# Patient Record
Sex: Male | Born: 1985 | Race: White | Hispanic: No | Marital: Single | Smoking: Current every day smoker
Health system: Southern US, Community
[De-identification: ages and names within clinical notes are randomized; demographics above are authoritative.]

---

## 2016-03-07 ENCOUNTER — Emergency Department (HOSPITAL_COMMUNITY): Payer: BLUE CROSS/BLUE SHIELD

## 2016-03-07 ENCOUNTER — Encounter (HOSPITAL_COMMUNITY): Payer: Self-pay | Admitting: Emergency Medicine

## 2016-03-07 ENCOUNTER — Emergency Department (HOSPITAL_COMMUNITY)
Admission: EM | Admit: 2016-03-07 | Discharge: 2016-03-08 | Disposition: A | Discharge status: Home / Self Care | Payer: BLUE CROSS/BLUE SHIELD | Attending: Emergency Medicine | Admitting: Emergency Medicine

## 2016-03-07 DIAGNOSIS — F17210 Nicotine dependence, cigarettes, uncomplicated: Secondary | ICD-10-CM | POA: Insufficient documentation

## 2016-03-07 DIAGNOSIS — N201 Calculus of ureter: Secondary | ICD-10-CM

## 2016-03-07 DIAGNOSIS — R109 Unspecified abdominal pain: Secondary | ICD-10-CM | POA: Diagnosis present

## 2016-03-07 LAB — BASIC METABOLIC PANEL
ANION GAP: 5 (ref 5–15)
BUN: 12 mg/dL (ref 6–20)
CO2: 28 mmol/L (ref 22–32)
Calcium: 8.9 mg/dL (ref 8.9–10.3)
Chloride: 102 mmol/L (ref 101–111)
Creatinine, Ser: 1.34 mg/dL — ABNORMAL HIGH (ref 0.61–1.24)
Glucose, Bld: 110 mg/dL — ABNORMAL HIGH (ref 65–99)
Potassium: 3.6 mmol/L (ref 3.5–5.1)
SODIUM: 135 mmol/L (ref 135–145)

## 2016-03-07 LAB — CBC WITH DIFFERENTIAL/PLATELET
BASOS ABS: 0 10*3/uL (ref 0.0–0.1)
Basophils Relative: 0 %
EOS PCT: 2 %
Eosinophils Absolute: 0.2 10*3/uL (ref 0.0–0.7)
HEMATOCRIT: 42.8 % (ref 39.0–52.0)
Hemoglobin: 14.8 g/dL (ref 13.0–17.0)
LYMPHS PCT: 12 %
Lymphs Abs: 1.7 10*3/uL (ref 0.7–4.0)
MCH: 32 pg (ref 26.0–34.0)
MCHC: 34.6 g/dL (ref 30.0–36.0)
MCV: 92.6 fL (ref 78.0–100.0)
Monocytes Absolute: 1.1 10*3/uL — ABNORMAL HIGH (ref 0.1–1.0)
Monocytes Relative: 8 %
NEUTROS ABS: 11 10*3/uL — AB (ref 1.7–7.7)
Neutrophils Relative %: 78 %
PLATELETS: 168 10*3/uL (ref 150–400)
RBC: 4.62 MIL/uL (ref 4.22–5.81)
RDW: 12.4 % (ref 11.5–15.5)
WBC: 14 10*3/uL — AB (ref 4.0–10.5)

## 2016-03-07 LAB — URINALYSIS, ROUTINE W REFLEX MICROSCOPIC
BILIRUBIN URINE: NEGATIVE
GLUCOSE, UA: NEGATIVE mg/dL
KETONES UR: NEGATIVE mg/dL
LEUKOCYTES UA: NEGATIVE
Nitrite: NEGATIVE
PROTEIN: NEGATIVE mg/dL
Specific Gravity, Urine: 1.023 (ref 1.005–1.030)
pH: 5 (ref 5.0–8.0)

## 2016-03-07 MED ORDER — HYDROMORPHONE HCL 1 MG/ML IJ SOLN
1.0000 mg | Freq: Once | INTRAMUSCULAR | Status: AC
  Administered 2016-03-07: 1 mg via INTRAVENOUS

## 2016-03-07 MED ORDER — SODIUM CHLORIDE 0.9 % IV SOLN
Freq: Once | INTRAVENOUS | Status: AC
  Administered 2016-03-07: 1000 mL/h via INTRAVENOUS

## 2016-03-07 MED ORDER — HYDROMORPHONE HCL 1 MG/ML IJ SOLN
INTRAMUSCULAR | Status: AC
  Filled 2016-03-07: qty 1

## 2016-03-07 MED ORDER — KETOROLAC TROMETHAMINE 30 MG/ML IJ SOLN
30.0000 mg | Freq: Once | INTRAMUSCULAR | Status: AC
  Administered 2016-03-07: 30 mg via INTRAVENOUS
  Filled 2016-03-07: qty 1

## 2016-03-07 MED ORDER — ONDANSETRON 8 MG PO TBDP
8.0000 mg | ORAL_TABLET | Freq: Three times a day (TID) | ORAL | 0 refills | Status: AC | PRN
Start: 2016-03-07 — End: ?

## 2016-03-07 MED ORDER — ONDANSETRON HCL 4 MG/2ML IJ SOLN: 4.0000 mg | Freq: Once | INTRAMUSCULAR | Status: DC

## 2016-03-07 MED ORDER — TAMSULOSIN HCL 0.4 MG PO CAPS
0.4000 mg | ORAL_CAPSULE | Freq: Once | ORAL | Status: AC
  Administered 2016-03-07: 0.4 mg via ORAL
  Filled 2016-03-07: qty 1

## 2016-03-07 MED ORDER — HYDROCODONE-ACETAMINOPHEN 5-325 MG PO TABS: 1.0000 | ORAL_TABLET | ORAL | 0 refills | Status: AC | PRN

## 2016-03-07 MED ORDER — TAMSULOSIN HCL 0.4 MG PO CAPS: 0.4000 mg | ORAL_CAPSULE | Freq: Every day | ORAL | 0 refills | Status: AC

## 2016-03-07 MED ORDER — ONDANSETRON HCL 4 MG/2ML IJ SOLN
4.0000 mg | Freq: Once | INTRAMUSCULAR | Status: AC
  Administered 2016-03-07: 4 mg via INTRAVENOUS
  Filled 2016-03-07: qty 2

## 2016-03-07 NOTE — Discharge Instructions (Signed)
Make sure you are drinking plenty of fluids and make sure to strain your urine and save the stone for the urologist if it passes.  Take your next dose of flomax tomorrow evening.  You may take the hydrocodone prescribed for pain relief.  This will make you drowsy - do not drive within 4 hours of taking this medication.

## 2016-03-07 NOTE — ED Notes (Signed)
Pt has been drinking water, pt vomiting at present

## 2016-03-07 NOTE — ED Provider Notes (Signed)
[facility]-EMERGENCY DEPT Provider Note   CSN: [id number] Arrival date & time: 03/07/16  1811  By signing my name below, I, Cynda Acres, attest that this documentation has been prepared under the direction and in the presence of Burgess Amor, PA-C. Electronically Signed: Cynda Acres, Scribe. 03/07/16. 6:40 PM.  History   Chief Complaint Chief Complaint  Patient presents with  . Flank Pain   HPI Comments: Randy Griffin is a 31 y.o. male with no pertinent medical history, who presents to the Emergency Department complaining of sudden-onset, intermittent  left flank pain that initially began a week and half ago, lasted a day and then was pain free until his symptoms returned today. Patient denies injury, the symptoms while he was at rest. Patient has associated difficulty urinating although was able to urinate just before arriving here. Patient tolerating fluids well, eating well. No modifying factors indicated. Patient has a family history of kidney stones. Patient denies any current hematuria, vomiting or fever but has been nauseated.   The history is provided by the patient. No language interpreter was used.    History reviewed. No pertinent past medical history.  There are no active problems to display for this patient.   History reviewed. No pertinent surgical history.     Home Medications    Prior to Admission medications   Medication Sig Start Date End Date Taking? Authorizing Provider  HYDROcodone-acetaminophen (NORCO/VICODIN) 5-325 MG tablet Take 1 tablet by mouth every 4 (four) hours as needed. 03/07/16   Burgess Amor, PA-C  ondansetron (ZOFRAN ODT) 8 MG disintegrating tablet Take 1 tablet (8 mg total) by mouth every 8 (eight) hours as needed for nausea or vomiting. 03/07/16   Burgess Amor, PA-C  tamsulosin (FLOMAX) 0.4 MG CAPS capsule Take 1 capsule (0.4 mg total) by mouth daily after supper. 03/07/16   Burgess Amor, PA-C    Family History History reviewed. No pertinent family  history.  Social History Social History  Substance Use Topics  . Smoking status: Current Every Day Smoker    Packs/day: 1.00    Types: Cigarettes  . Smokeless tobacco: Never Used  . Alcohol use No     Allergies   Patient has no known allergies.   Review of Systems Review of Systems  Constitutional: Negative for fever.  HENT: Negative.   Respiratory: Negative.   Cardiovascular: Negative.   Gastrointestinal: Positive for abdominal pain and nausea. Negative for vomiting.  Genitourinary: Positive for difficulty urinating and flank pain. Negative for dysuria, hematuria, penile pain and testicular pain.     Physical Exam Updated Vital Signs BP 126/83 (BP Location: Right Arm)   Pulse 70   Temp 97.7 F (36.5 C) (Oral)   Resp 16   Ht 6\' 1"  (1.854 m)   Wt 70.3 kg   SpO2 100%   BMI 20.45 kg/m   Physical Exam  Constitutional: He is oriented to person, place, and time. He appears well-developed.  HENT:  Head: Normocephalic and atraumatic.  Mouth/Throat: Oropharynx is clear and moist.  Eyes: Conjunctivae and EOM are normal. Pupils are equal, round, and reactive to light.  Neck: Normal range of motion. Neck supple.  Cardiovascular: Normal rate.   Pulmonary/Chest: Effort normal and breath sounds normal.  Abdominal: Soft. Bowel sounds are normal. He exhibits no distension. There is tenderness. There is no guarding.  LUQ tenderness with no CVA tenderness. No guarding. Normal bowel sounds.   Musculoskeletal: Normal range of motion.  Neurological: He is alert and oriented to person, place, and  time.  Skin: Skin is warm and dry.  Psychiatric: He has a normal mood and affect.     ED Treatments / Results  DIAGNOSTIC STUDIES: Oxygen Saturation is 100% on RA, normal by my interpretation.    COORDINATION OF CARE: 6:40 PM Discussed treatment plan with pt at bedside and pt agreed to plan, which includes a renal CT and pain medication.   Labs (all labs ordered are listed, but  only abnormal results are displayed) Labs Reviewed  BASIC METABOLIC PANEL - Abnormal; Notable for the following:       Result Value   Glucose, Bld 110 (*)    Creatinine, Ser 1.34 (*)    All other components within normal limits  CBC WITH DIFFERENTIAL/PLATELET - Abnormal; Notable for the following:    WBC 14.0 (*)    Neutro Abs 11.0 (*)    Monocytes Absolute 1.1 (*)    All other components within normal limits  URINALYSIS, ROUTINE W REFLEX MICROSCOPIC - Abnormal; Notable for the following:    APPearance HAZY (*)    Hgb urine dipstick MODERATE (*)    Bacteria, UA RARE (*)    All other components within normal limits    EKG  EKG Interpretation None       Radiology Ct Renal Stone Study  Result Date: 03/07/2016 CLINICAL DATA:  Left flank pain EXAM: CT ABDOMEN AND PELVIS WITHOUT CONTRAST TECHNIQUE: Multidetector CT imaging of the abdomen and pelvis was performed following the standard protocol without IV contrast. COMPARISON:  None. FINDINGS: Lower chest: No acute abnormality. Hepatobiliary: No focal liver abnormality is seen. No gallstones, gallbladder wall thickening, or biliary dilatation. Pancreas: Unremarkable. No pancreatic ductal dilatation or surrounding inflammatory changes. Spleen: Normal in size without focal abnormality. Adrenals/Urinary Tract: Normal adrenal glands. 4 mm distal left ureteral calculus resulting in mild left hydroureteronephrosis. Normal bladder. Stomach/Bowel: Stomach is within normal limits. Appendix appears normal. No evidence of bowel wall thickening, distention, or inflammatory changes. Vascular/Lymphatic: No significant vascular findings are present. No enlarged abdominal or pelvic lymph nodes. Reproductive: Prostate is unremarkable. Other: No abdominal wall hernia or abnormality. No abdominopelvic ascites. Musculoskeletal: No acute osseous abnormality. No lytic or sclerotic osseous lesion. IMPRESSION: 1. 4 mm distal left ureteral calculus resulting in mild  left hydroureteronephrosis. Electronically Signed   By: Elige Ko   On: 03/07/2016 19:41    Procedures Procedures (including critical care time)  Medications Ordered in ED Medications  ondansetron (ZOFRAN) injection 4 mg (4 mg Intravenous Not Given 03/07/16 2254)  ketorolac (TORADOL) 30 MG/ML injection 30 mg (30 mg Intravenous Given 03/07/16 1855)  ondansetron (ZOFRAN) injection 4 mg (4 mg Intravenous Given 03/07/16 1853)  HYDROmorphone (DILAUDID) injection 1 mg (1 mg Intravenous Given 03/07/16 1911)  0.9 %  sodium chloride infusion ( Intravenous Stopped 03/07/16 2053)  0.9 %  sodium chloride infusion ( Intravenous Stopped 03/07/16 2205)  tamsulosin (FLOMAX) capsule 0.4 mg (0.4 mg Oral Given 03/07/16 2058)     Initial Impression / Assessment and Plan / ED Course  I have reviewed the triage vital signs and the nursing notes.  Pertinent labs & imaging results that were available during my care of the patient were reviewed by me and considered in my medical decision making (see chart for details).    Pt received IV fluids 2L prior to being able to provide urine sample, but surprisingly urine not overly concentrated.  He also tolerated PO fluids.  Discussed with Dr. Annabell Howells, given slight bump in creatinine.  Will followup  up in office, encouraged increasing PO fluids.  Distal ureteral stone 4 mm which pt should be able to pass.  He was given a flomax tab, script for same.  Zofran, hydrocodone. Discussed strict return precautions.    The patient appears reasonably screened and/or stabilized for discharge and I doubt any other medical condition or other [facility] requiring further screening, evaluation, or treatment in the ED at this time prior to discharge.   Final Clinical Impressions(s) / ED Diagnoses   Final diagnoses:  Ureterolithiasis    New Prescriptions New Prescriptions   HYDROCODONE-ACETAMINOPHEN (NORCO/VICODIN) 5-325 MG TABLET    Take 1 tablet by mouth every 4 (four) hours as needed.    ONDANSETRON (ZOFRAN ODT) 8 MG DISINTEGRATING TABLET    Take 1 tablet (8 mg total) by mouth every 8 (eight) hours as needed for nausea or vomiting.   TAMSULOSIN (FLOMAX) 0.4 MG CAPS CAPSULE    Take 1 capsule (0.4 mg total) by mouth daily after supper.  I personally performed the services described in this documentation, which was scribed in my presence. The recorded information has been reviewed and is accurate.     Burgess AmorJulie Lequan Dobratz, PA-C 03/08/16 0055    Samuel JesterKathleen McManus, DO 03/08/16 1501

## 2016-03-07 NOTE — ED Notes (Signed)
Bladder scanner , pt has been unable to void, pt ambulatory to the bathroom to try again

## 2016-03-07 NOTE — ED Triage Notes (Signed)
Pt reports left flank pain with difficulty urinating today.

## 2016-03-08 MED ORDER — HYDROCODONE-ACETAMINOPHEN 5-325 MG PO TABS: 1.0000 | ORAL_TABLET | ORAL | 0 refills | Status: AC | PRN

## 2016-03-14 MED FILL — Hydrocodone-Acetaminophen Tab 5-325 MG: ORAL | Qty: 6 | Status: AC

## 2018-08-22 IMAGING — CT CT RENAL STONE PROTOCOL
2 of 4 series · 16 of 46 positions shown, 18 images · non-contrast
Comparison: None.

CLINICAL DATA: Left flank pain

EXAM:
CT ABDOMEN AND PELVIS WITHOUT CONTRAST
TECHNIQUE: Multidetector CT imaging of the abdomen and pelvis was performed
following the standard protocol without IV contrast.

[Series 2: axial st · axial · 0.66mm/px · z∈[+935,+1370]mm · 13 of 95 slices shown, 15 images]
[im 4/95  soft-tissue]
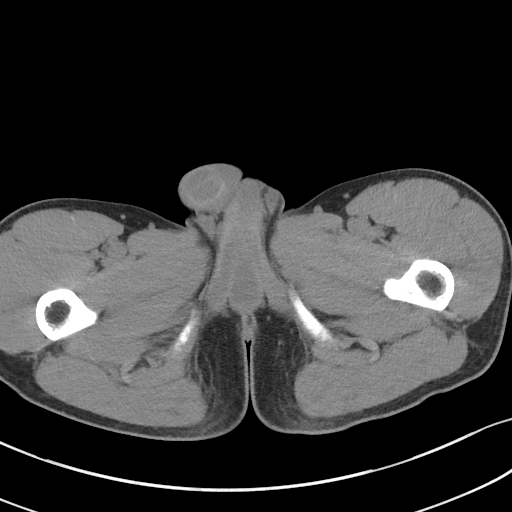
[im 4/95  bone]
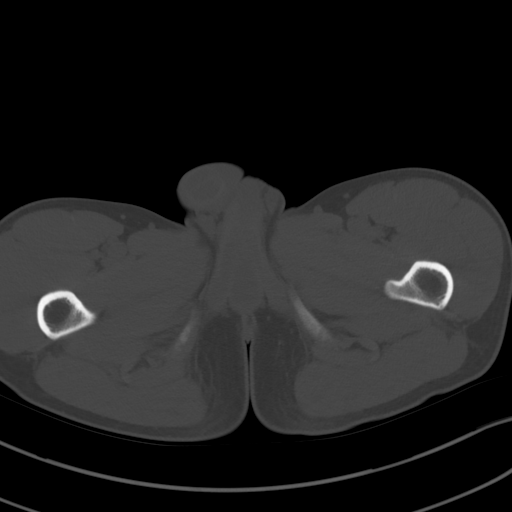
[im 12/95  soft-tissue]
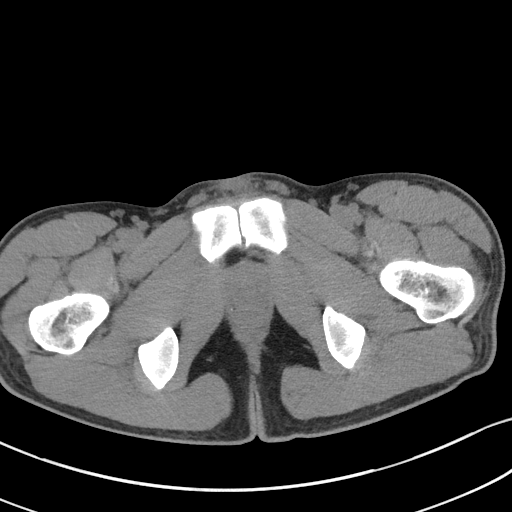
[im 20/95  soft-tissue]
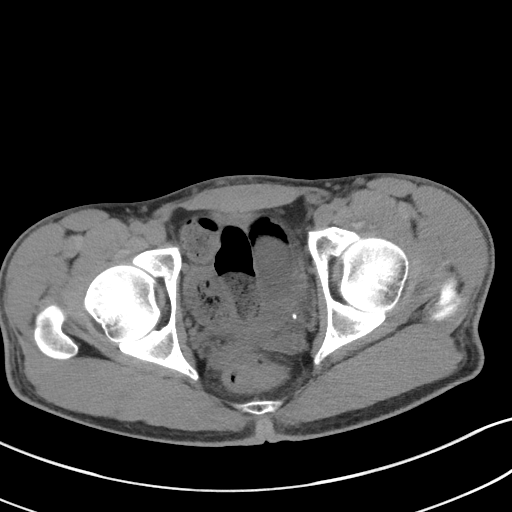
[im 28/95  soft-tissue]
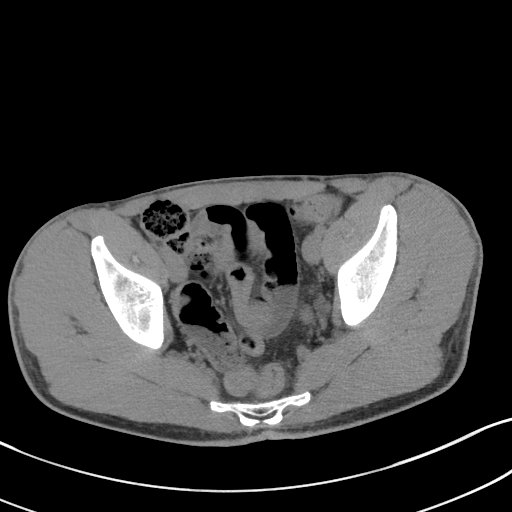
[im 32/95  soft-tissue]
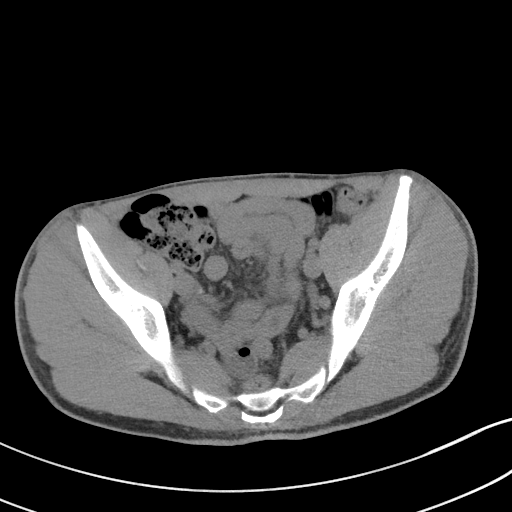
[im 40/95  soft-tissue]
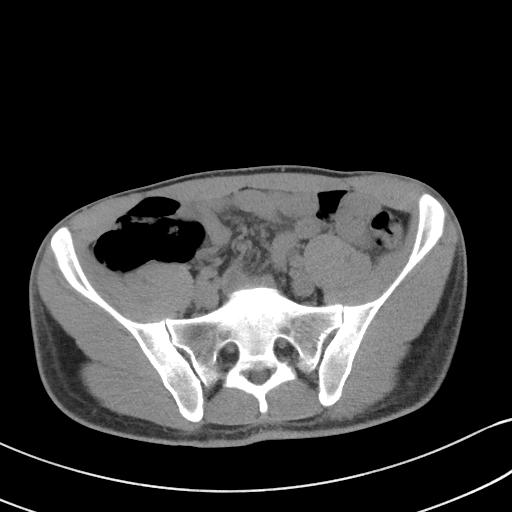
[im 48/95  soft-tissue]
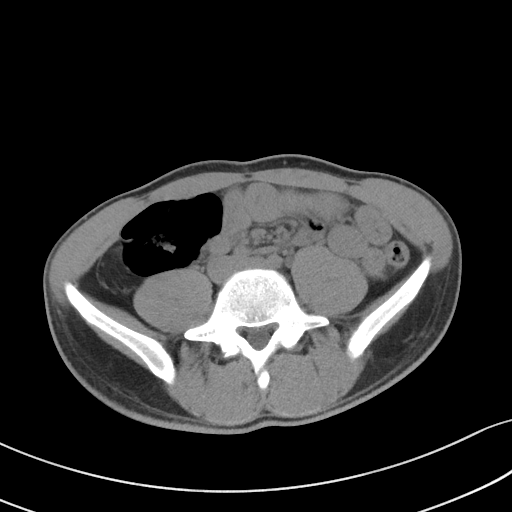
[im 55/95  soft-tissue]
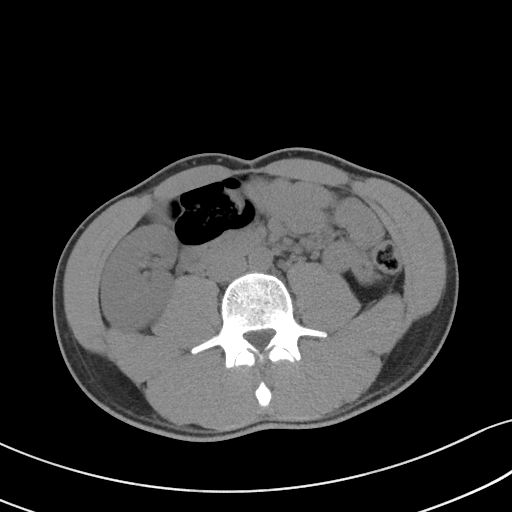
[im 63/95  soft-tissue]
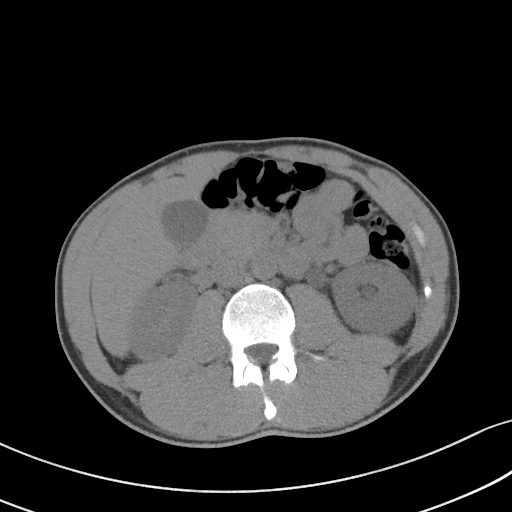
[im 63/95  bone]
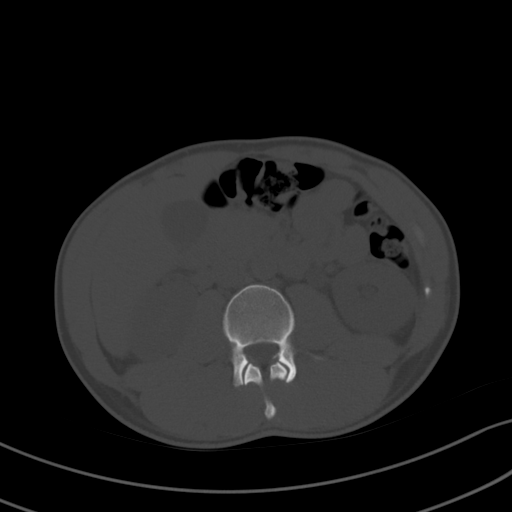
[im 67/95  soft-tissue]
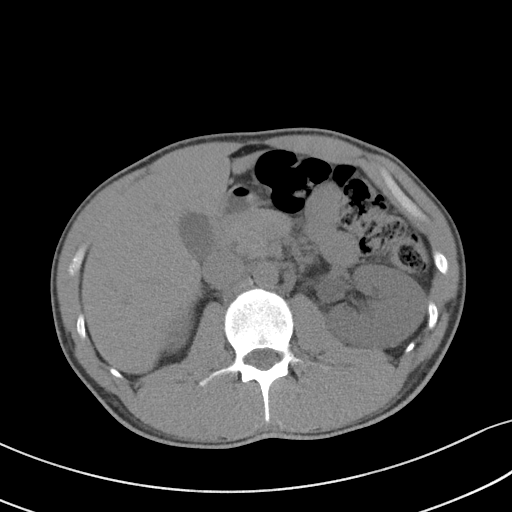
[im 75/95  soft-tissue]
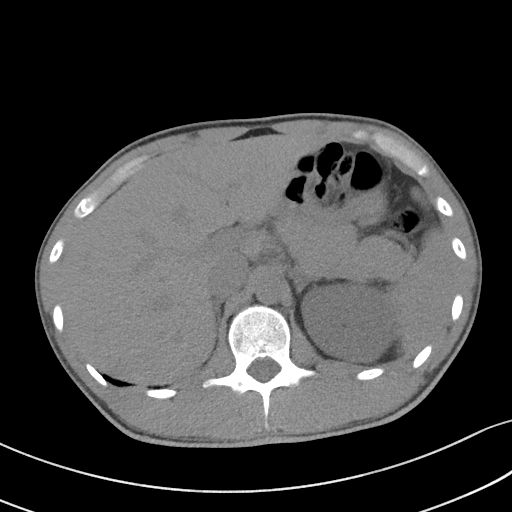
[im 83/95  soft-tissue]
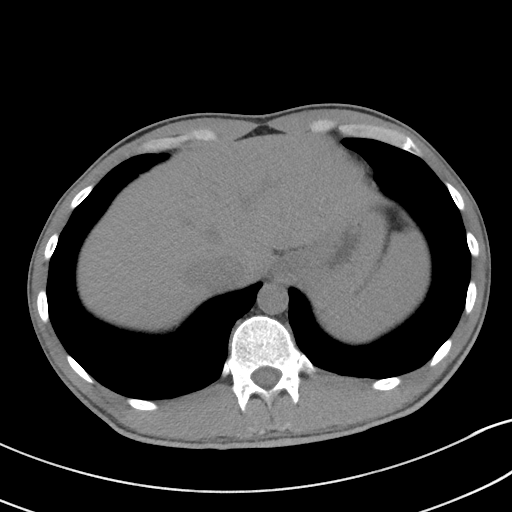
[im 91/95  soft-tissue]
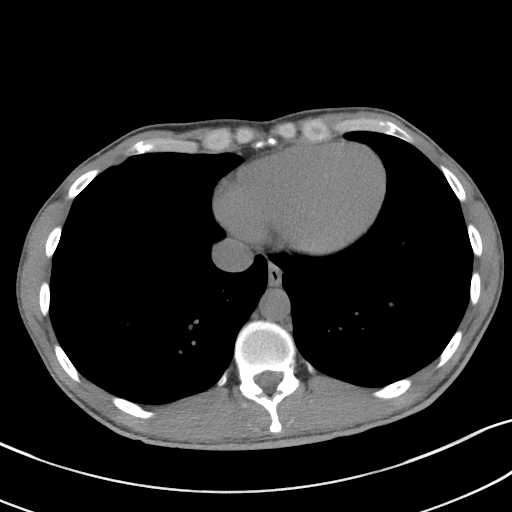

[Series 5: coronal st · coronal · 0.73mm/px · 3 of 73 slices shown]
[im 25/73  soft-tissue]
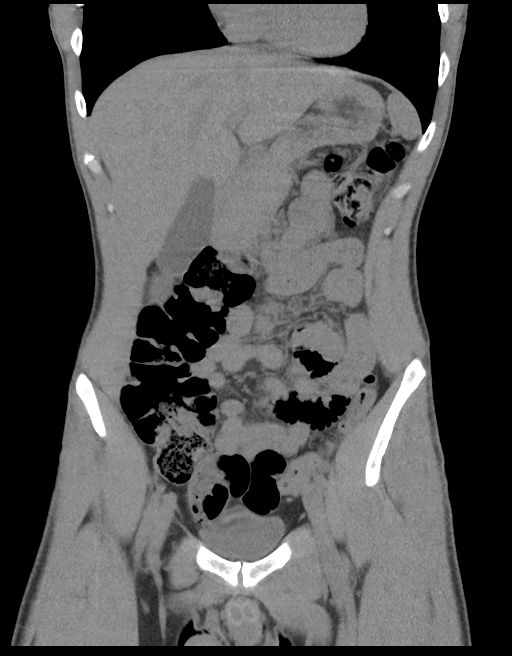
[im 33/73  soft-tissue]
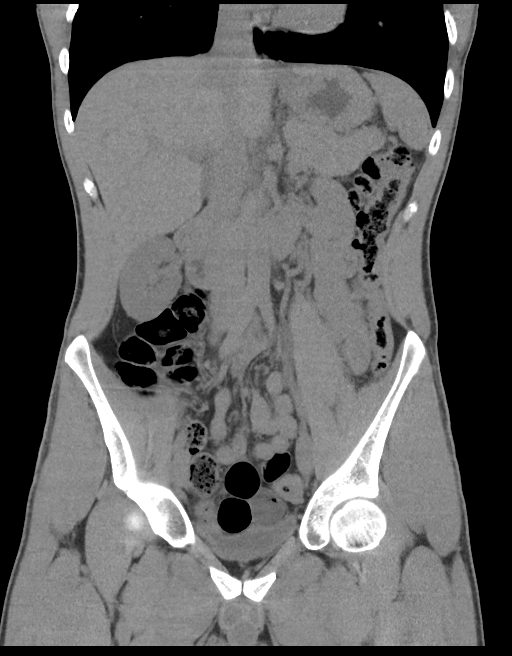
[im 41/73  soft-tissue]
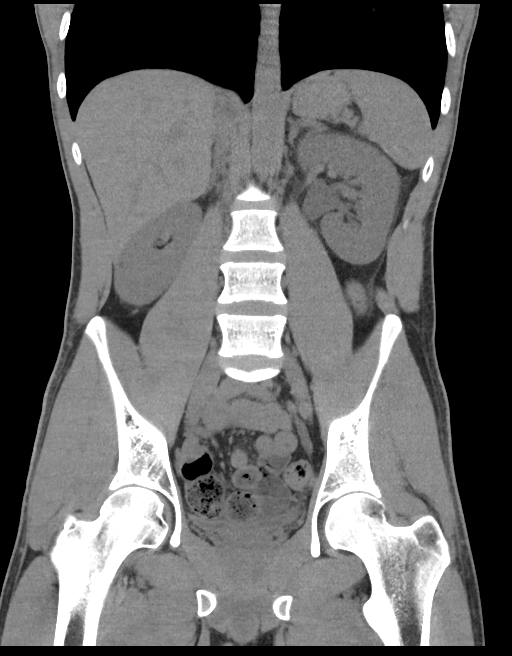

[16 of 46 positions shown; findings below may reference images not displayed]

FINDINGS: Lower chest: No acute abnormality.

Hepatobiliary: No focal liver abnormality is seen. No gallstones,
gallbladder wall thickening, or biliary dilatation.

Pancreas: Unremarkable. No pancreatic ductal dilatation or
surrounding inflammatory changes.

Spleen: Normal in size without focal abnormality.

Adrenals/Urinary Tract: Normal adrenal glands. 4 mm distal left
ureteral calculus resulting in mild left hydroureteronephrosis.
Normal bladder.

Stomach/Bowel: Stomach is within normal limits. Appendix appears
normal. No evidence of bowel wall thickening, distention, or
inflammatory changes.

Vascular/Lymphatic: No significant vascular findings are present. No
enlarged abdominal or pelvic lymph nodes.

Reproductive: Prostate is unremarkable.

Other: No abdominal wall hernia or abnormality. No abdominopelvic
ascites.

Musculoskeletal: No acute osseous abnormality. No lytic or sclerotic
osseous lesion.
IMPRESSION: 1. 4 mm distal left ureteral calculus resulting in mild left
hydroureteronephrosis.

## 2018-11-27 ENCOUNTER — Other Ambulatory Visit: Payer: Self-pay | Admitting: *Deleted

## 2018-11-27 DIAGNOSIS — Z20822 Contact with and (suspected) exposure to covid-19: Secondary | ICD-10-CM

## 2018-11-28 LAB — NOVEL CORONAVIRUS, NAA: SARS-CoV-2, NAA: NOT DETECTED

## 2018-11-30 ENCOUNTER — Telehealth: Payer: Self-pay | Admitting: *Deleted

## 2018-11-30 NOTE — Telephone Encounter (Signed)
Patient called and given negative covid results,also set up with my chart .
